# Patient Record
Sex: Male | Born: 1990 | Race: Black or African American | Hispanic: No | Marital: Single | Smoking: Never smoker
Health system: Southern US, Community
[De-identification: ages and names within clinical notes are randomized; demographics above are authoritative.]

## PROBLEM LIST (undated history)

## (undated) DIAGNOSIS — E119 Type 2 diabetes mellitus without complications: Secondary | ICD-10-CM

---

## 2019-05-30 ENCOUNTER — Encounter (HOSPITAL_COMMUNITY): Payer: Self-pay

## 2019-05-30 ENCOUNTER — Other Ambulatory Visit: Payer: Self-pay

## 2019-05-30 DIAGNOSIS — K219 Gastro-esophageal reflux disease without esophagitis: Secondary | ICD-10-CM | POA: Diagnosis present

## 2019-05-30 DIAGNOSIS — Z8249 Family history of ischemic heart disease and other diseases of the circulatory system: Secondary | ICD-10-CM

## 2019-05-30 DIAGNOSIS — Z7289 Other problems related to lifestyle: Secondary | ICD-10-CM

## 2019-05-30 DIAGNOSIS — I471 Supraventricular tachycardia: Secondary | ICD-10-CM | POA: Diagnosis present

## 2019-05-30 DIAGNOSIS — Z833 Family history of diabetes mellitus: Secondary | ICD-10-CM

## 2019-05-30 DIAGNOSIS — Z79899 Other long term (current) drug therapy: Secondary | ICD-10-CM

## 2019-05-30 DIAGNOSIS — E861 Hypovolemia: Secondary | ICD-10-CM | POA: Diagnosis present

## 2019-05-30 DIAGNOSIS — E1010 Type 1 diabetes mellitus with ketoacidosis without coma: Secondary | ICD-10-CM | POA: Diagnosis not present

## 2019-05-30 DIAGNOSIS — N179 Acute kidney failure, unspecified: Secondary | ICD-10-CM | POA: Diagnosis present

## 2019-05-30 DIAGNOSIS — I429 Cardiomyopathy, unspecified: Secondary | ICD-10-CM | POA: Diagnosis present

## 2019-05-30 DIAGNOSIS — F329 Major depressive disorder, single episode, unspecified: Secondary | ICD-10-CM | POA: Diagnosis present

## 2019-05-30 DIAGNOSIS — Z794 Long term (current) use of insulin: Secondary | ICD-10-CM

## 2019-05-30 DIAGNOSIS — Z20822 Contact with and (suspected) exposure to covid-19: Secondary | ICD-10-CM | POA: Diagnosis present

## 2019-05-30 LAB — CBG MONITORING, ED: Glucose-Capillary: 270 mg/dL — ABNORMAL HIGH (ref 70–99)

## 2019-05-30 LAB — CBC
HCT: 51.6 % (ref 39.0–52.0)
Hemoglobin: 16.5 g/dL (ref 13.0–17.0)
MCH: 29 pg (ref 26.0–34.0)
MCHC: 32 g/dL (ref 30.0–36.0)
MCV: 90.8 fL (ref 80.0–100.0)
Platelets: 387 10*3/uL (ref 150–400)
RBC: 5.68 MIL/uL (ref 4.22–5.81)
RDW: 13.1 % (ref 11.5–15.5)
WBC: 12.5 10*3/uL — ABNORMAL HIGH (ref 4.0–10.5)
nRBC: 0 % (ref 0.0–0.2)

## 2019-05-30 LAB — BASIC METABOLIC PANEL
Anion gap: 22 — ABNORMAL HIGH (ref 5–15)
BUN: 11 mg/dL (ref 6–20)
CO2: 15 mmol/L — ABNORMAL LOW (ref 22–32)
Calcium: 9.8 mg/dL (ref 8.9–10.3)
Chloride: 101 mmol/L (ref 98–111)
Creatinine, Ser: 1.39 mg/dL — ABNORMAL HIGH (ref 0.61–1.24)
GFR calc Af Amer: >60 mL/min (ref >60)
GFR calc non Af Amer: >60 mL/min (ref >60)
Glucose, Bld: 317 mg/dL — ABNORMAL HIGH (ref 70–99)
Potassium: 4.8 mmol/L (ref 3.5–5.1)
Sodium: 138 mmol/L (ref 135–145)

## 2019-05-30 NOTE — ED Triage Notes (Signed)
Pt sts hyperglycemia, headache, 5 episodes of vomiting and dizziness and fatigue since yesterday morning. Type 1 diabetic.

## 2019-05-31 ENCOUNTER — Inpatient Hospital Stay (HOSPITAL_COMMUNITY): Payer: BC Managed Care – PPO

## 2019-05-31 ENCOUNTER — Inpatient Hospital Stay (HOSPITAL_COMMUNITY)
Admission: EM | Admit: 2019-05-31 | Discharge: 2019-06-01 | DRG: 638 | Disposition: A | Discharge status: Home / Self Care | Payer: BC Managed Care – PPO | Attending: Internal Medicine | Admitting: Internal Medicine

## 2019-05-31 DIAGNOSIS — Z8249 Family history of ischemic heart disease and other diseases of the circulatory system: Secondary | ICD-10-CM | POA: Diagnosis not present

## 2019-05-31 DIAGNOSIS — Z7289 Other problems related to lifestyle: Secondary | ICD-10-CM | POA: Diagnosis not present

## 2019-05-31 DIAGNOSIS — E861 Hypovolemia: Secondary | ICD-10-CM | POA: Diagnosis present

## 2019-05-31 DIAGNOSIS — Z79899 Other long term (current) drug therapy: Secondary | ICD-10-CM | POA: Diagnosis not present

## 2019-05-31 DIAGNOSIS — E1110 Type 2 diabetes mellitus with ketoacidosis without coma: Secondary | ICD-10-CM | POA: Diagnosis present

## 2019-05-31 DIAGNOSIS — Z20822 Contact with and (suspected) exposure to covid-19: Secondary | ICD-10-CM | POA: Diagnosis present

## 2019-05-31 DIAGNOSIS — E1010 Type 1 diabetes mellitus with ketoacidosis without coma: Secondary | ICD-10-CM | POA: Diagnosis present

## 2019-05-31 DIAGNOSIS — I428 Other cardiomyopathies: Secondary | ICD-10-CM | POA: Diagnosis not present

## 2019-05-31 DIAGNOSIS — Z833 Family history of diabetes mellitus: Secondary | ICD-10-CM | POA: Diagnosis not present

## 2019-05-31 DIAGNOSIS — I471 Supraventricular tachycardia, unspecified: Secondary | ICD-10-CM | POA: Diagnosis present

## 2019-05-31 DIAGNOSIS — K219 Gastro-esophageal reflux disease without esophagitis: Secondary | ICD-10-CM | POA: Diagnosis present

## 2019-05-31 DIAGNOSIS — F329 Major depressive disorder, single episode, unspecified: Secondary | ICD-10-CM | POA: Diagnosis present

## 2019-05-31 DIAGNOSIS — Z794 Long term (current) use of insulin: Secondary | ICD-10-CM | POA: Diagnosis not present

## 2019-05-31 DIAGNOSIS — N179 Acute kidney failure, unspecified: Secondary | ICD-10-CM | POA: Diagnosis present

## 2019-05-31 DIAGNOSIS — I429 Cardiomyopathy, unspecified: Secondary | ICD-10-CM | POA: Diagnosis present

## 2019-05-31 HISTORY — DX: Type 2 diabetes mellitus without complications: E11.9

## 2019-05-31 LAB — CBG MONITORING, ED
Glucose-Capillary: 288 mg/dL — ABNORMAL HIGH (ref 70–99)
Glucose-Capillary: 251 mg/dL — ABNORMAL HIGH (ref 70–99)
Glucose-Capillary: 190 mg/dL — ABNORMAL HIGH (ref 70–99)
Glucose-Capillary: 212 mg/dL — ABNORMAL HIGH (ref 70–99)
Glucose-Capillary: 163 mg/dL — ABNORMAL HIGH (ref 70–99)
Glucose-Capillary: 166 mg/dL — ABNORMAL HIGH (ref 70–99)
Glucose-Capillary: 174 mg/dL — ABNORMAL HIGH (ref 70–99)
Glucose-Capillary: 144 mg/dL — ABNORMAL HIGH (ref 70–99)

## 2019-05-31 LAB — GLUCOSE, CAPILLARY
Glucose-Capillary: 128 mg/dL — ABNORMAL HIGH (ref 70–99)
Glucose-Capillary: 166 mg/dL — ABNORMAL HIGH (ref 70–99)
Glucose-Capillary: 192 mg/dL — ABNORMAL HIGH (ref 70–99)
Glucose-Capillary: 228 mg/dL — ABNORMAL HIGH (ref 70–99)
Glucose-Capillary: 191 mg/dL — ABNORMAL HIGH (ref 70–99)

## 2019-05-31 LAB — BLOOD GAS, VENOUS
Acid-base deficit: 10.4 mmol/L — ABNORMAL HIGH (ref 0.0–2.0)
Bicarbonate: 15.3 mmol/L — ABNORMAL LOW (ref 20.0–28.0)
O2 Saturation: 56.8 %
Patient temperature: 98.6
pCO2, Ven: 34.7 mmHg — ABNORMAL LOW (ref 44.0–60.0)
pH, Ven: 7.268 (ref 7.250–7.430)
pO2, Ven: 31.3 mmHg — CL (ref 32.0–45.0)

## 2019-05-31 LAB — MAGNESIUM: Magnesium: 1.7 mg/dL (ref 1.7–2.4)

## 2019-05-31 LAB — BASIC METABOLIC PANEL
Anion gap: 19 — ABNORMAL HIGH (ref 5–15)
Anion gap: 11 (ref 5–15)
Anion gap: 15 (ref 5–15)
Anion gap: 8 (ref 5–15)
BUN: 10 mg/dL (ref 6–20)
BUN: 8 mg/dL (ref 6–20)
BUN: 8 mg/dL (ref 6–20)
BUN: 7 mg/dL (ref 6–20)
CO2: 15 mmol/L — ABNORMAL LOW (ref 22–32)
CO2: 15 mmol/L — ABNORMAL LOW (ref 22–32)
CO2: 17 mmol/L — ABNORMAL LOW (ref 22–32)
CO2: 21 mmol/L — ABNORMAL LOW (ref 22–32)
Calcium: 9.3 mg/dL (ref 8.9–10.3)
Calcium: 7.9 mg/dL — ABNORMAL LOW (ref 8.9–10.3)
Calcium: 8.8 mg/dL — ABNORMAL LOW (ref 8.9–10.3)
Calcium: 8.7 mg/dL — ABNORMAL LOW (ref 8.9–10.3)
Chloride: 101 mmol/L (ref 98–111)
Chloride: 105 mmol/L (ref 98–111)
Chloride: 106 mmol/L (ref 98–111)
Chloride: 107 mmol/L (ref 98–111)
Creatinine, Ser: 1.41 mg/dL — ABNORMAL HIGH (ref 0.61–1.24)
Creatinine, Ser: 1.52 mg/dL — ABNORMAL HIGH (ref 0.61–1.24)
Creatinine, Ser: 1.11 mg/dL (ref 0.61–1.24)
Creatinine, Ser: 0.88 mg/dL (ref 0.61–1.24)
GFR calc Af Amer: >60 mL/min (ref >60)
GFR calc Af Amer: >60 mL/min (ref >60)
GFR calc Af Amer: >60 mL/min (ref >60)
GFR calc Af Amer: >60 mL/min (ref >60)
GFR calc non Af Amer: >60 mL/min (ref >60)
GFR calc non Af Amer: >60 mL/min (ref >60)
GFR calc non Af Amer: >60 mL/min (ref >60)
GFR calc non Af Amer: >60 mL/min (ref >60)
Glucose, Bld: 277 mg/dL — ABNORMAL HIGH (ref 70–99)
Glucose, Bld: 173 mg/dL — ABNORMAL HIGH (ref 70–99)
Glucose, Bld: 146 mg/dL — ABNORMAL HIGH (ref 70–99)
Glucose, Bld: 215 mg/dL — ABNORMAL HIGH (ref 70–99)
Potassium: 4.8 mmol/L (ref 3.5–5.1)
Potassium: 4.9 mmol/L (ref 3.5–5.1)
Potassium: 3.7 mmol/L (ref 3.5–5.1)
Potassium: 4 mmol/L (ref 3.5–5.1)
Sodium: 135 mmol/L (ref 135–145)
Sodium: 131 mmol/L — ABNORMAL LOW (ref 135–145)
Sodium: 138 mmol/L (ref 135–145)
Sodium: 136 mmol/L (ref 135–145)

## 2019-05-31 LAB — URINALYSIS, ROUTINE W REFLEX MICROSCOPIC
Bilirubin Urine: NEGATIVE
Glucose, UA: >=500 mg/dL — AB
Hgb urine dipstick: NEGATIVE
Ketones, ur: 80 mg/dL — AB
Leukocytes,Ua: NEGATIVE
Nitrite: NEGATIVE
Protein, ur: 30 mg/dL — AB
Specific Gravity, Urine: 1.023 (ref 1.005–1.030)
pH: 5 (ref 5.0–8.0)

## 2019-05-31 LAB — HEMOGLOBIN A1C
Hgb A1c MFr Bld: 11.3 % — ABNORMAL HIGH (ref 4.8–5.6)
Mean Plasma Glucose: 277.61 mg/dL

## 2019-05-31 LAB — SARS CORONAVIRUS 2 BY RT PCR (HOSPITAL ORDER, PERFORMED IN ~~LOC~~ HOSPITAL LAB): SARS Coronavirus 2: NEGATIVE

## 2019-05-31 LAB — BETA-HYDROXYBUTYRIC ACID
Beta-Hydroxybutyric Acid: 6.4 mmol/L — ABNORMAL HIGH (ref 0.05–0.27)
Beta-Hydroxybutyric Acid: 3.85 mmol/L — ABNORMAL HIGH (ref 0.05–0.27)

## 2019-05-31 LAB — HIV ANTIBODY (ROUTINE TESTING W REFLEX): HIV Screen 4th Generation wRfx: NONREACTIVE

## 2019-05-31 LAB — MRSA PCR SCREENING: MRSA by PCR: NEGATIVE

## 2019-05-31 MED ORDER — INSULIN ASPART 100 UNIT/ML ~~LOC~~ SOLN
0.0000 [IU] | Freq: Three times a day (TID) | SUBCUTANEOUS | Status: DC
  Administered 2019-05-31: 5 [IU] via SUBCUTANEOUS
  Administered 2019-06-01: 8 [IU] via SUBCUTANEOUS
  Administered 2019-06-01: 5 [IU] via SUBCUTANEOUS

## 2019-05-31 MED ORDER — ONDANSETRON HCL 4 MG/2ML IJ SOLN
4.0000 mg | Freq: Once | INTRAMUSCULAR | Status: AC
  Administered 2019-05-31: 4 mg via INTRAVENOUS
  Filled 2019-05-31: qty 2

## 2019-05-31 MED ORDER — DEXTROSE-NACL 5-0.45 % IV SOLN: INTRAVENOUS | Status: DC

## 2019-05-31 MED ORDER — INSULIN ASPART 100 UNIT/ML ~~LOC~~ SOLN
6.0000 [IU] | Freq: Three times a day (TID) | SUBCUTANEOUS | Status: DC
  Administered 2019-05-31 – 2019-06-01 (×3): 6 [IU] via SUBCUTANEOUS

## 2019-05-31 MED ORDER — SODIUM CHLORIDE 0.9 % IV SOLN: INTRAVENOUS | Status: DC

## 2019-05-31 MED ORDER — DEXTROSE 50 % IV SOLN: 0.0000 mL | INTRAVENOUS | Status: DC | PRN

## 2019-05-31 MED ORDER — POTASSIUM CHLORIDE CRYS ER 20 MEQ PO TBCR
40.0000 meq | EXTENDED_RELEASE_TABLET | Freq: Once | ORAL | Status: AC
  Administered 2019-05-31: 40 meq via ORAL
  Filled 2019-05-31: qty 2

## 2019-05-31 MED ORDER — POTASSIUM CHLORIDE 10 MEQ/100ML IV SOLN
10.0000 meq | INTRAVENOUS | Status: AC
  Administered 2019-05-31 (×2): 10 meq via INTRAVENOUS
  Filled 2019-05-31 (×2): qty 100

## 2019-05-31 MED ORDER — INSULIN REGULAR(HUMAN) IN NACL 100-0.9 UT/100ML-% IV SOLN
INTRAVENOUS | Status: DC
  Administered 2019-05-31: 10 [IU]/h via INTRAVENOUS
  Filled 2019-05-31: qty 100

## 2019-05-31 MED ORDER — CARVEDILOL 3.125 MG PO TABS
3.1250 mg | ORAL_TABLET | Freq: Two times a day (BID) | ORAL | Status: DC
  Administered 2019-05-31 (×2): 3.125 mg via ORAL
  Filled 2019-05-31 (×2): qty 1

## 2019-05-31 MED ORDER — LACTATED RINGERS IV BOLUS
1000.0000 mL | INTRAVENOUS | Status: AC
  Administered 2019-05-31 (×2): 1000 mL via INTRAVENOUS

## 2019-05-31 MED ORDER — FLUOXETINE HCL 10 MG PO CAPS
10.0000 mg | ORAL_CAPSULE | Freq: Every day | ORAL | Status: DC
  Filled 2019-05-31 (×2): qty 1

## 2019-05-31 MED ORDER — PANTOPRAZOLE SODIUM 40 MG PO TBEC
40.0000 mg | DELAYED_RELEASE_TABLET | Freq: Every day | ORAL | Status: DC
  Administered 2019-05-31 – 2019-06-01 (×2): 40 mg via ORAL
  Filled 2019-05-31 (×2): qty 1

## 2019-05-31 MED ORDER — CHLORHEXIDINE GLUCONATE CLOTH 2 % EX PADS
6.0000 | MEDICATED_PAD | Freq: Every day | CUTANEOUS | Status: DC
  Administered 2019-05-31 – 2019-06-01 (×2): 6 via TOPICAL

## 2019-05-31 MED ORDER — INSULIN DETEMIR 100 UNIT/ML ~~LOC~~ SOLN
20.0000 [IU] | Freq: Two times a day (BID) | SUBCUTANEOUS | Status: DC
  Administered 2019-05-31 (×2): 20 [IU] via SUBCUTANEOUS
  Filled 2019-05-31 (×3): qty 0.2

## 2019-05-31 MED ORDER — HEPARIN SODIUM (PORCINE) 5000 UNIT/ML IJ SOLN
5000.0000 [IU] | Freq: Three times a day (TID) | INTRAMUSCULAR | Status: DC
  Administered 2019-05-31: 5000 [IU] via SUBCUTANEOUS
  Filled 2019-05-31 (×2): qty 1

## 2019-05-31 MED ORDER — FENTANYL CITRATE (PF) 100 MCG/2ML IJ SOLN
50.0000 ug | Freq: Once | INTRAMUSCULAR | Status: AC
  Administered 2019-05-31: 50 ug via INTRAVENOUS
  Filled 2019-05-31: qty 2

## 2019-05-31 MED ORDER — INSULIN REGULAR(HUMAN) IN NACL 100-0.9 UT/100ML-% IV SOLN
INTRAVENOUS | Status: DC
  Administered 2019-05-31: 3.4 [IU]/h via INTRAVENOUS
  Filled 2019-05-31: qty 100

## 2019-05-31 NOTE — ED Notes (Signed)
Attempted to call report on pt. Nurse is tied up in another room at this time and will call back for report shortly.

## 2019-05-31 NOTE — Progress Notes (Signed)
Inpatient Diabetes Program Recommendations  AACE/ADA: New Consensus Statement on Inpatient Glycemic Control (2015)  Target Ranges:  Prepandial:   less than 140 mg/dL      Peak postprandial:   less than 180 mg/dL (1-2 hours)      Critically ill patients:  140 - 180 mg/dL   Lab Results  Component Value Date   GLUCAP 128 (H) 05/31/2019   HGBA1C 11.3 (H) 05/30/2019    Review of Glycemic Control  Diabetes history: DM1 Outpatient Diabetes medications: Levemir 40 units QHS, Humalog 5-10 units tidwc Current orders for Inpatient glycemic control: Transitioning off insulin drip - Levemir 20 units bid, Novolog 0-15 units tidwc  HgbA1C - 11.3% - uncontrolled  Inpatient Diabetes Program Recommendations:     Add Novolog 6 units tidwc for meal coverage insulin. (Pt Type 1 and does not make any insulin - will need meal coverage for CHOs)   Spoke with pt about his Type 1 DM and HgbA1C of 11.3%. Pt states he lives in [place] and came to [place] to visit his GF. Forgot his Levemir pen and thought he had an extra one at [facility] house. Started feeling poorly, but thought it was food poisoning. When started vomiting, came to ED. Has had DM1 x 3 years. Uses [employer] CGM. States he sees MD every 3 months. We discussed tighter glucose control and calling MD when blood sugars are consistently > 180 mg/dL. Also instructed that if he ever forgets his insulin, he can always go to a [employer] and buy Novolin N or Novolin R. Pt appreciative of information. Discussed importance of controlling blood sugars to prevent long and short-term complications. Pt states he will "get more serious" about his diabetes, and this was a good wake-up call. Answered all questions.   Will follow closely.  Thank you. Ailene Ards, RD, LDN, CDE Inpatient Diabetes Coordinator [phone]

## 2019-05-31 NOTE — Progress Notes (Addendum)
PROGRESS NOTE  Derek Snyder QQV:[id number] DOB: [birth year] DOA: 05/31/2019 PCP: Patient, No Pcp Per   LOS: 0 days   Brief Narrative / Interim history: Derek Snyder is a 29 y.o. male with a diagnosis of T1DM in 2018 with ICU admission for DKA, non-compaction cardiomyopathy, PSVT, and GERD who presents to the ED with nausea, vomiting, chills, abdominal pain, body aches, headache and dizziness which began on 5/17 (although he told ED providers 5/19). He states his symptoms are similar to his previous DKA in 2018, "though not as bad as last time". Pt lives in [place] and is in town visiting his girlfriend. Pt is followed by PCP and Endocrinology in [place]. Per pt, last A1c in January was "12". Pt received his first Covid vaccination on Friday 5/14.   Subjective / 24h Interval events: Pt appears comfortable and resting in bed on rounding. He is currently "feeling much better" and nausea/vomiting/abdominal pain are relieved by current therapies. Pt currently denies any HA, dizziness. Pt states he has been taking his insulin as prescribed and eating normally prior to admission.    Assessment & Plan: Principal Problem Type 1 Diabetes Mellitus with Diabetic Ketoacidosis - VBG drawn in ED on 5/20 shows pH 7.268, Bicarb of 15.3 pCO2 34.7. Ketones 80 and >500 glucose in urine. Beta-Hydroxybutyric acid 6.40. Anion gap 11. Hemoglobin A1c 11.3. Pt begun on continuous NS and insulin drip in ED and currently on 5% dextrose/.45% NaCl with blood glucose 144. Continue CBG monitoring and fluids per DKA protocol. Pt NPO, strict I&Os. Upon resolution of DKA, resume sliding scale insulin and advance diabetic diet as tolerated.  Active Problems Non-compaction cardiomyopathy - EKG on 5/20 suggestive of L atrial enlargement. Continue carvedilol 3.125mg  and follow up with outpatient cardiology.  Paroxysmal SVT - EKG demonstrating NSR. Maintain cardiac monitoring. Follow up with outpatient  cardiology.  Gastroesophageal reflux disease - Continue pantoprazole 40mg . Outpatient follow-up with PCP.   Scheduled Meds: . carvedilol  3.125 mg Oral BID WC  . FLUoxetine  10 mg Oral Daily  . heparin  5,000 Units Subcutaneous Q8H  . pantoprazole  40 mg Oral Daily   Continuous Infusions: . sodium chloride    . dextrose 5 % and 0.45% NaCl 75 mL/hr at 05/31/19 0408  . insulin 2.8 Units/hr (05/31/19 0726)   PRN Meds:.dextrose  DVT prophylaxis: Heparin Code Status: Full code Family Communication: None  Status is: Inpatient  Remains inpatient appropriate because:IV treatments appropriate due to intensity of illness or inability to take PO and Inpatient level of care appropriate due to severity of illness   Dispo: The patient is from: Home              Anticipated d/c is to: Home              Anticipated d/c date is: 2 days              Patient currently is not medically stable to d/c.   Consultants:  None  Procedures:  2D echo: None Foley: None BiPAP: None HD: None   Microbiology  Negative Covid  Antimicrobials: None   Objective: Vitals:   05/31/19 0745 05/31/19 0800 05/31/19 0815 05/31/19 0830  BP: 110/64 102/64 97/61 106/61  Pulse: 82 83 76 80  Resp: 16 15 15 14   Temp:      TempSrc:      SpO2: 95% 98% 98% 96%  Weight:      Height:       No intake  or output data in the 24 hours ending 05/31/19 0901 Filed Weights   05/30/19 2242  Weight: 93 kg    Examination:  Constitutional: NAD Eyes: no scleral icterus ENMT: Mucous membranes are moist.  Neck: normal, supple Respiratory: clear to auscultation bilaterally, no wheezing, no crackles. Normal respiratory effort. No accessory muscle use.  Cardiovascular: Regular rate and rhythm, no murmurs / rubs / gallops. No LE edema. Good peripheral pulses Abdomen: non distended, no tenderness to palpation in all quadrants. Bowel sounds positive.  Musculoskeletal: no clubbing / cyanosis.  Skin: no  rashes Neurologic: grossly non-focal. Psychiatric: Normal judgment and insight. Alert and oriented x 3. Normal mood.    Data Reviewed: I have independently reviewed following labs and imaging studies   CBC: Recent Labs  Lab 05/30/19 2250  WBC 12.5*  HGB 16.5  HCT 51.6  MCV 90.8  PLT 387   Basic Metabolic Panel: Recent Labs  Lab 05/30/19 2250 05/31/19 0201 05/31/19 0544  NA 138 135 131*  K 4.8 4.8 4.9  CL 101 101 105  CO2 15* 15* 15*  GLUCOSE 317* 277* 173*  BUN 11 10 8   CREATININE 1.39* 1.41* 1.52*  CALCIUM 9.8 9.3 7.9*   Liver Function Tests: No results for input(s): AST, ALT, ALKPHOS, BILITOT, PROT, ALBUMIN in the last 168 hours. Coagulation Profile: No results for input(s): INR, PROTIME in the last 168 hours. HbA1C: Recent Labs    05/30/19 2250  HGBA1C 11.3*   CBG: Recent Labs  Lab 05/31/19 0332 05/31/19 0438 05/31/19 0538 05/31/19 0632 05/31/19 0721  GLUCAP 190* 212* 163* 174* 166*    Recent Results (from the past 240 hour(s))  SARS Coronavirus 2 by RT PCR (hospital order, performed in [facility] hospital lab) Nasopharyngeal Urine, Clean Catch     Status: None   Collection Time: 05/31/19  2:02 AM   Specimen: Urine, Clean Catch; Nasopharyngeal  Result Value Ref Range Status   SARS Coronavirus 2 NEGATIVE NEGATIVE Final    Comment: (NOTE) SARS-CoV-2 target nucleic acids are NOT DETECTED. The SARS-CoV-2 RNA is generally detectable in upper and lower respiratory specimens during the acute phase of infection. The lowest concentration of SARS-CoV-2 viral copies this assay can detect is 250 copies / mL. A negative result does not preclude SARS-CoV-2 infection and should not be used as the sole basis for treatment or other patient management decisions.  A negative result may occur with improper specimen collection / handling, submission of specimen other than nasopharyngeal swab, presence of viral mutation(s) within the areas targeted by this assay,  and inadequate number of viral copies (<250 copies / mL). A negative result must be combined with clinical observations, patient history, and epidemiological information. Fact Sheet for Patients:   06/02/19 Fact Sheet for Healthcare Providers: BoilerBrush.com.cy This test is not yet approved or cleared  by the https://pope.com/ FDA and has been authorized for detection and/or diagnosis of SARS-CoV-2 by FDA under an Emergency Use Authorization (EUA).  This EUA will remain in effect (meaning this test can be used) for the duration of the COVID-19 declaration under Section 564(b)(1) of the Act, 21 U.S.C. section 360bbb-3(b)(1), unless the authorization is terminated or revoked sooner. Performed at [facility], [address] [place], [place]      Radiology Studies: DG CHEST PORT 1 VIEW  Result Date: 05/31/2019 CLINICAL DATA:  Initial evaluation for DKA. EXAM: PORTABLE CHEST 1 VIEW COMPARISON:  None available. FINDINGS: Cardiac and mediastinal silhouettes are within normal limits.  Lungs mildly hypoinflated. No focal infiltrates. No edema or effusion. No pneumothorax. No acute osseous finding. IMPRESSION: No radiographic evidence for active cardiopulmonary disease. Electronically Signed   By: Rise Mu M.D.   On: 05/31/2019 04:53

## 2019-05-31 NOTE — Plan of Care (Signed)

## 2019-05-31 NOTE — Progress Notes (Addendum)
PROGRESS NOTE    Derek Snyder  QIW:[id number] DOB: 03/04/1990 DOA: 05/31/2019 PCP: Patient, No Pcp Per    Brief Narrative:  Patient admitted to the hospital with a working diagnosis of diabetes ketoacidosis.  29 year old male with type 1 diabetes mellitus, GERD, PSVT, and noncompaction cardiomyopathy who presents with nausea, vomiting and uncontrolled hyperglycemia for 24 hours.  Apparently patient not taking basal insulin for about 48 hours, he is currently traveling and forgot to bring his basal insulin.  His glucose has been uncontrolled, up to 200s over the last few months.  On his initial physical examination blood pressure 134/92, heart rate 92, respiratory rate 21, oxygen saturation 99%, his lungs were clear to auscultation bilaterally, heart S1-S2 present rhythmic, soft abdomen, no lower extremity edema. Sodium 138, potassium 4.8, chloride 101, bicarb 15, glucose 317, BUN 11, creatinine 1.39, anion gap 22, SARS COVID-19 negative.  Urine analysis more than 500 glucose, specific gravity 1.023.  Chest radiograph with no infiltrates.  EKG 88 bpm, normal axis, normal intervals, sinus rhythm, small Q-wave lead I aVL, no ST segment changes, lateral T wave inversions.    Assessment & Plan:   Principal Problem:   DKA (diabetic ketoacidoses) ([place]) Active Problems:   GERD (gastroesophageal reflux disease)   SVT (supraventricular tachycardia) (H[facility])   Non-compaction cardiomyopathy ([place])   1. Acute diabetes ketoacidosis. Patient with improved nausea and vomiting, he has been NPO since admission. His anion gap is closing down to 15 but not back to normal, and is capillary glucose has been 144, 128 and 166. K is down to 3,7 and bicarbonate continue to be low at 17.   Will continue medical therapy with insulin, will transition to sq and will advance his diet. Calculated insulin requirements base on insulin drip use is about 40 units. Will start with basal insulin 20 units bid, will add 6  units of premeal insulin and continue with insulin sliding scale.  Follow renal panel and mg this pm at 16:00. Patient continue to be at high risk for worsening acidosis.   2. AKI. Pre-renal renal injury, renal function with serum cr down to 1,11 with K at 3,7 and serum bicarbonate at 17.   Patient with no further nausea or vomiting, no diarrhea, will advance diet to carb modified and will hold on IV fluids and will follow on renal panel in pm.   3. SVT/ Non compaction cardiomyopathy. Will continue telemetry monitoring, no clinical signs of heart failure.   Continue with carvedilol.  4. GERD. Continue antiacid therapy.   Status is: Inpatient  Remains inpatient appropriate because:Hemodynamically unstable and Ongoing active pain requiring inpatient pain management. Continue need close monitoring of acid base status and electrolytes.    Dispo: The patient is from: Home              Anticipated d/c is to: Home              Anticipated d/c date is: 1 day              Patient currently is not medically stable to d/c.    DVT prophylaxis: Enoxaparin   Code Status:    full  Family Communication:  No family at the bedside      Subjective: Patient feeling better but not yet back to baseline, no nausea or vomiting, he has been npo since admission.   Objective: Vitals:   05/31/19 1007 05/31/19 1100 05/31/19 1200 05/31/19 1300  BP: 131/81 (!) 145/73 140/82 138/84  Pulse: 90 82 83 84  Resp: 15 13 16 17   Temp: 98.3 F (36.8 C)  97.6 F (36.4 C)   TempSrc: Oral  Oral   SpO2: 100% 100% 98% 100%  Weight: 89.7 kg     Height: 5\' 11"  (1.803 m)       Intake/Output Summary (Last 24 hours) at 05/31/2019 1435 Last data filed at 05/31/2019 1100 Gross per 24 hour  Intake 543.74 ml  Output --  Net 543.74 ml   Filed Weights   05/30/19 2242 05/31/19 1007  Weight: 93 kg 89.7 kg    Examination:   General: Not in pain or dyspnea, deconditioned  Neurology: Awake and alert, non focal  E  ENT: no pallor, no icterus, oral mucosa moist Cardiovascular: No JVD. S1-S2 present, rhythmic, no gallops, rubs, or murmurs. No lower extremity edema. Pulmonary: positive breath sounds bilaterally, adequate air movement, no wheezing, rhonchi or rales. Gastrointestinal. Abdomen with, no organomegaly. Skin. No rashes Musculoskeletal: no joint deformities     Data Reviewed: I have personally reviewed following labs and imaging studies  CBC: Recent Labs  Lab 05/30/19 2250  WBC 12.5*  HGB 16.5  HCT 51.6  MCV 90.8  PLT 387   Basic Metabolic Panel: Recent Labs  Lab 05/30/19 2250 05/31/19 0201 05/31/19 0544 05/31/19 1047  NA 138 135 131* 138  K 4.8 4.8 4.9 3.7  CL 101 101 105 106  CO2 15* 15* 15* 17*  GLUCOSE 317* 277* 173* 146*  BUN 11 10 8 8   CREATININE 1.39* 1.41* 1.52* 1.11  CALCIUM 9.8 9.3 7.9* 8.8*   GFR: Estimated Creatinine Clearance: 105.5 mL/min (by C-G formula based on SCr of 1.11 mg/dL). Liver Function Tests: No results for input(s): AST, ALT, ALKPHOS, BILITOT, PROT, ALBUMIN in the last 168 hours. No results for input(s): LIPASE, AMYLASE in the last 168 hours. No results for input(s): AMMONIA in the last 168 hours. Coagulation Profile: No results for input(s): INR, PROTIME in the last 168 hours. Cardiac Enzymes: No results for input(s): CKTOTAL, CKMB, CKMBINDEX, TROPONINI in the last 168 hours. BNP (last 3 results) No results for input(s): PROBNP in the last 8760 hours. HbA1C: Recent Labs    05/30/19 2250  HGBA1C 11.3*   CBG: Recent Labs  Lab 05/31/19 0632 05/31/19 0721 05/31/19 0927 05/31/19 1150 05/31/19 1313  GLUCAP 174* 166* 144* 128* 166*   Lipid Profile: No results for input(s): CHOL, HDL, LDLCALC, TRIG, CHOLHDL, LDLDIRECT in the last 72 hours. Thyroid Function Tests: No results for input(s): TSH, T4TOTAL, FREET4, T3FREE, THYROIDAB in the last 72 hours. Anemia Panel: No results for input(s): VITAMINB12, FOLATE, FERRITIN, TIBC, IRON,  RETICCTPCT in the last 72 hours.    Radiology Studies: I have reviewed all of the imaging during this hospital visit personally     Scheduled Meds: . carvedilol  3.125 mg Oral BID WC  . Chlorhexidine Gluconate Cloth  6 each Topical Daily  . FLUoxetine  10 mg Oral Daily  . heparin  5,000 Units Subcutaneous Q8H  . insulin aspart  0-15 Units Subcutaneous TID WC  . insulin aspart  6 Units Subcutaneous TID WC  . insulin detemir  20 Units Subcutaneous BID  . pantoprazole  40 mg Oral Daily   Continuous Infusions: . dextrose 5 % and 0.45% NaCl 75 mL/hr at 05/31/19 1100  . insulin 1.6 mL/hr at 05/31/19 1100     LOS: 0 days        Marlette Curvin 06/02/19, MD

## 2019-05-31 NOTE — H&P (Signed)
History and Physical    Derek Snyder [id number]:[id number] DOB: 07-28-1990 DOA: 05/31/2019  PCP: Patient, No Pcp Per  Patient coming from: Home  I have personally briefly reviewed patient's old medical records in [facility]  Chief Complaint: Nausea vomiting and hyperglycemia since yesterday.  HPI: Derek Snyder is a 29 y.o. male with poorly controlled DM type I, GERD, PSVT, noncompaction cardiomyopathy EF 45-50% presented with above.  States he has been taking his insulin as prescribed and been eating normally.  He is not sure what triggered DKA today.  No fevers, cough, diarrhea.    Symptoms started Monday with nausea vomiting and intermittent abdominal pain.  Abdominal pain initially colicky 12 out of 10 without any alleviating or aggravating factors. There was associated nausea and vomiting.  He denies any fever chills.  No cough.  No diarrhea.  Personal social history.  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  Confirmed is an use person and works in [place].  Denies any smoking.  Occasional EtOH use.  Denies any drugs.  Family history: Mother 14 with hypertension.  Dad is 68 also with hypertension.  Maternal grandmother with diabetes.   ED Course:  Vital signs with 134/92, heart rate 92, respiratory rate 18, SPO2 90s on room air.  -Labs with sodium 138, potassium 4.8, bicarb 15, serum glucose 277 -Anion gap 22-> 19 -Beta hydroxybutyrate acid 6.49 -VBG with 7.2 6/34/31/15  -BUN 10, creatinine 1.41-WBC 12.5- -Urinalysis with glucose more than 500, positive for ketones. -Covid PCR negative   ED medications: -1 L of Ringer lactate -Zofran -50 MCG of fentanyl  Review of Systems: As per HPI otherwise 10 point review of systems negative.   Review of Systems  Constitutional: Negative.   HENT: Negative.   Eyes: Negative.   Respiratory: Negative.   Cardiovascular: Negative.   Gastrointestinal: Positive for abdominal pain, nausea and  vomiting. Negative for blood in stool, constipation, diarrhea and melena.  Genitourinary: Negative.   Musculoskeletal: Negative.   Skin: Negative.   Neurological: Negative.   Endo/Heme/Allergies: Negative.   Psychiatric/Behavioral: Negative.    Past Medical History:  Diagnosis Date  . Diabetes mellitus without complication (HCC)    type 1     History reviewed. No pertinent surgical history.   reports that he has never smoked. He has never used smokeless tobacco. He reports current alcohol use. He reports that he does not use drugs.  No Known Allergies  No family history on file.   Prior to Admission medications   Medication Sig Start Date End Date Taking? Authorizing Provider  HUMALOG KWIKPEN 100 UNIT/ML KwikPen See admin instructions. INJECT 5-10 UNITS WITH EACH MEAL PLUSE SCALE* MAX DAILY DOSE OF 50 UNITS* 01/25/19  Yes [provider]  LEVEMIR FLEXTOUCH 100 UNIT/ML FlexPen See admin instructions. INJECT 40 UNITS INTO THE SKIN AT BEDTIME 03/30/19  Yes [provider]  pantoprazole (PROTONIX) 40 MG tablet Take 40 mg by mouth daily. 01/17/19  Yes [provider]    Physical Exam: Vitals:   05/30/19 2242 05/31/19 0055 05/31/19 0215 05/31/19 0230  BP:  (!) 134/92 132/78 (!) 117/101  Pulse:  92 78 (!) 102  Resp:  18 (!) 21 17  Temp:      TempSrc:      SpO2:  100% 99% 99%  Weight: 93 kg     Height: 5\' 11"  (1.803 m)       Constitutional: NAD, calm, comfortable Vitals:   05/30/19 2242  05/31/19 0055 05/31/19 0215 05/31/19 0230  BP:  (!) 134/92 132/78 (!) 117/101  Pulse:  92 78 (!) 102  Resp:  18 (!) 21 17  Temp:      TempSrc:      SpO2:  100% 99% 99%  Weight: 93 kg     Height: 5\' 11"  (1.803 m)      Eyes: PERRL, lids and conjunctivae normal ENMT: Mucous membranes are moist. Posterior pharynx clear of any exudate or lesions.Normal dentition.  Neck: normal, supple, no masses, no thyromegaly Respiratory: clear to auscultation bilaterally, no  wheezing, no crackles. Normal respiratory effort. No accessory muscle use.  Cardiovascular: Regular rate and rhythm, no murmurs / rubs / gallops. No extremity edema. 2+ pedal pulses. No carotid bruits.  Abdomen: no tenderness, no masses palpated. No hepatosplenomegaly. Bowel sounds positive.  Musculoskeletal: no clubbing / cyanosis. No joint deformity upper and lower extremities. Good ROM, no contractures. Normal muscle tone.  Skin: no rashes, lesions, ulcers. No induration Neurologic: CN 2-12 grossly intact. Sensation intact, DTR normal. Strength 5/5 in all 4.  Psychiatric: Normal judgment and insight. Alert and oriented x 3. Normal mood.   Labs on Admission: I have personally reviewed following labs and imaging studies  CBC: Recent Labs  Lab 05/30/19 2250  WBC 12.5*  HGB 16.5  HCT 51.6  MCV 90.8  PLT 833   Basic Metabolic Panel: Recent Labs  Lab 05/30/19 2250 05/31/19 0201  NA 138 135  K 4.8 4.8  CL 101 101  CO2 15* 15*  GLUCOSE 317* 277*  BUN 11 10  CREATININE 1.39* 1.41*  CALCIUM 9.8 9.3   GFR: Estimated Creatinine Clearance: 90.9 mL/min (A) (by C-G formula based on SCr of 1.41 mg/dL (H)). Liver Function Tests: No results for input(s): AST, ALT, ALKPHOS, BILITOT, PROT, ALBUMIN in the last 168 hours. No results for input(s): LIPASE, AMYLASE in the last 168 hours. No results for input(s): AMMONIA in the last 168 hours. Coagulation Profile: No results for input(s): INR, PROTIME in the last 168 hours. Cardiac Enzymes: No results for input(s): CKTOTAL, CKMB, CKMBINDEX, TROPONINI in the last 168 hours. BNP (last 3 results) No results for input(s): PROBNP in the last 8760 hours. HbA1C: No results for input(s): HGBA1C in the last 72 hours. CBG: Recent Labs  Lab 05/30/19 2240 05/31/19 0140 05/31/19 0244  GLUCAP 270* 288* 251*   Lipid Profile: No results for input(s): CHOL, HDL, LDLCALC, TRIG, CHOLHDL, LDLDIRECT in the last 72 hours. Thyroid Function Tests: No  results for input(s): TSH, T4TOTAL, FREET4, T3FREE, THYROIDAB in the last 72 hours. Anemia Panel: No results for input(s): VITAMINB12, FOLATE, FERRITIN, TIBC, IRON, RETICCTPCT in the last 72 hours. Urine analysis:    Component Value Date/Time   COLORURINE STRAW (A) 05/30/2019 2250   APPEARANCEUR CLEAR 05/30/2019 2250   LABSPEC 1.023 05/30/2019 2250   PHURINE 5.0 05/30/2019 2250   GLUCOSEU >=500 (A) 05/30/2019 2250   HGBUR NEGATIVE 05/30/2019 2250   BILIRUBINUR NEGATIVE 05/30/2019 2250   KETONESUR 80 (A) 05/30/2019 2250   PROTEINUR 30 (A) 05/30/2019 2250   NITRITE NEGATIVE 05/30/2019 2250   LEUKOCYTESUR NEGATIVE 05/30/2019 2250    Radiological Exams on Admission: No results found.  EKG: Independently reviewed.  NSR at 88 bpm, normal axis, T wave inversion inferolateral leads.  Intervals within normal limits.  Assessment/Plan Active Problems:   DKA (diabetic ketoacidoses) ([place])    #DKA -Unclear trigger. -Patient takes Levemir 40 units bedtime and reports compliance. -We will continue management per DKA  protocol.  #Noncompaction cardiomyopathy -Continue with Coreg, aspirin.  #Depression -Continue with Prozac  #GERD -Continue with Protonix.  DVT prophylaxis: Subcu heparin.    Code Status: Full code   Family Communication: None  Disposition Plan: Home.    Consults called: None. Admission status: Inpatient   Mirabelle Cyphers MD [facility]   If 7PM-7AM, please contact night-coverage [email or web address] Password [email or web address]  05/31/2019, 3:01 AM

## 2019-05-31 NOTE — ED Provider Notes (Addendum)
TIME SEEN: 1:08 AM  CHIEF COMPLAINT: Hyperglycemia, abdominal pain, nausea and vomiting, lightheadedness  HPI: Patient is a 29 year old male with history of type 1 diabetes currently on insulin who was diagnosed about 3 years ago with diabetes when he was found to be in DKA in [facility] and was admitted to the ICU for several days who presents today with similar symptoms to his previous episode of DKA including hyperglycemia, diffuse abdominal pain, nausea and vomiting, lightheadedness today.  States he has been taking his insulin as prescribed and been eating normally.  He is not sure what triggered DKA today.  No fevers, cough, diarrhea.  He had his first Covid vaccination on Friday.  ROS: See HPI Constitutional: no fever  Eyes: no drainage  ENT: no runny nose   Cardiovascular:  no chest pain  Resp: no SOB  GI:  vomiting GU: no dysuria Integumentary: no rash  Allergy: no hives  Musculoskeletal: no leg swelling  Neurological: no slurred speech ROS otherwise negative  PAST MEDICAL HISTORY/PAST SURGICAL HISTORY:  Past Medical History:  Diagnosis Date  . Diabetes mellitus without complication (HCC)    type 1     MEDICATIONS:  Prior to Admission medications   Not on File    ALLERGIES:  No Known Allergies  SOCIAL HISTORY:  Social History   Tobacco Use  . Smoking status: Never Smoker  . Smokeless tobacco: Never Used  Substance Use Topics  . Alcohol use: Yes    Comment: occ    FAMILY HISTORY: No family history on file.  EXAM: BP (!) 134/92 (BP Location: Left Arm)   Pulse 92   Temp 98.1 F (36.7 C) (Oral)   Resp 18   Ht 5\' 11"  (1.803 m)   Wt 93 kg   SpO2 100%   BMI 28.59 kg/m  CONSTITUTIONAL: Alert and oriented and responds appropriately to questions. Well-appearing; well-nourished, afebrile, nontoxic HEAD: Normocephalic EYES: Conjunctivae clear, pupils appear equal, EOM appear intact ENT: normal nose; dry mucous membranes NECK: Supple, normal  ROM CARD: RRR; S1 and S2 appreciated; no murmurs, no clicks, no rubs, no gallops RESP: Normal chest excursion without splinting or tachypnea; breath sounds clear and equal bilaterally; no wheezes, no rhonchi, no rales, no hypoxia or respiratory distress, speaking full sentences, no Kussmaul respirations ABD/GI: Normal bowel sounds; non-distended; soft, non-tender, no rebound, no guarding, no peritoneal signs, no hepatosplenomegaly BACK:  The back appears normal EXT: Normal ROM in all joints; no deformity noted, no edema; no cyanosis SKIN: Normal color for age and race; warm; no rash on exposed skin NEURO: Moves all extremities equally PSYCH: The patient's mood and manner are appropriate.   MEDICAL DECISION MAKING: Patient here with DKA.  Bicarb is 15 and anion gap is 22.  Will obtain urinalysis, VBG.  Will recheck blood sugar and initiate IV fluids and IV insulin infusion.  Will treat symptomatically with pain and nausea medicine.  Patient will need admission.  His PCP and endocrinologist are both in [facility].  ED PROGRESS: Blood gas shows pH of 7.268 with a bicarb of 15.3.  Large ketones in the urine.  Will consult hospitalist for admission.  3:19 AM Discussed patient's case with hospitalist, Dr. [facility].  I have recommended admission and patient (and family if present) agree with this plan. Admitting physician will place admission orders.   I reviewed all nursing notes, vitals, pertinent previous records and reviewed/interpreted all EKGs, lab and urine results, imaging (as available).    EKG Interpretation  Date/Time:  Thursday May 31 2019 03:37:31 EDT Ventricular Rate:  88 PR Interval:    QRS Duration: 81 QT Interval:  287 QTC Calculation: 348 R Axis:   35 Text Interpretation: Sinus rhythm Probable left atrial enlargement RSR' in V1 or V2, probably normal variant Borderline T wave abnormalities No old tracing to compare Confirmed by Ward, Cyril Mourning [phone]) on 05/31/2019  3:41:28 AM        CRITICAL CARE Performed by: Cyril Mourning Ward   Total critical care time: 65 minutes  Critical care time was exclusive of separately billable procedures and treating other patients.  Critical care was necessary to treat or prevent imminent or life-threatening deterioration.  Critical care was time spent personally by me on the following activities: development of treatment plan with patient and/or surrogate as well as nursing, discussions with consultants, evaluation of patient's response to treatment, examination of patient, obtaining history from patient or surrogate, ordering and performing treatments and interventions, ordering and review of laboratory studies, ordering and review of radiographic studies, pulse oximetry and re-evaluation of patient's condition.   Jassiel Dshawn Stencel was evaluated in Emergency Department on 05/31/2019 for the symptoms described in the history of present illness. He was evaluated in the context of the global COVID-19 pandemic, which necessitated consideration that the patient might be at risk for infection with the SARS-CoV-2 virus that causes COVID-19. Institutional protocols and algorithms that pertain to the evaluation of patients at risk for COVID-19 are in a state of rapid change based on information released by regulatory bodies including the CDC and federal and state organizations. These policies and algorithms were followed during the patient's care in the ED.      Ward, Delice Bison, DO 05/31/19 0320    Ward, Delice Bison, DO 05/31/19 [id number]

## 2019-05-31 NOTE — ED Notes (Signed)
Pt states that he's unable to give urine at this time. 

## 2019-06-01 LAB — GLUCOSE, CAPILLARY
Glucose-Capillary: 223 mg/dL — ABNORMAL HIGH (ref 70–99)
Glucose-Capillary: 295 mg/dL — ABNORMAL HIGH (ref 70–99)

## 2019-06-01 LAB — BASIC METABOLIC PANEL
Anion gap: 13 (ref 5–15)
BUN: 8 mg/dL (ref 6–20)
CO2: 19 mmol/L — ABNORMAL LOW (ref 22–32)
Calcium: 8.9 mg/dL (ref 8.9–10.3)
Chloride: 104 mmol/L (ref 98–111)
Creatinine, Ser: 1.06 mg/dL (ref 0.61–1.24)
GFR calc Af Amer: >60 mL/min (ref >60)
GFR calc non Af Amer: >60 mL/min (ref >60)
Glucose, Bld: 271 mg/dL — ABNORMAL HIGH (ref 70–99)
Potassium: 4.3 mmol/L (ref 3.5–5.1)
Sodium: 136 mmol/L (ref 135–145)

## 2019-06-01 LAB — MAGNESIUM: Magnesium: 1.6 mg/dL — ABNORMAL LOW (ref 1.7–2.4)

## 2019-06-01 MED ORDER — MAGNESIUM SULFATE 2 GM/50ML IV SOLN
2.0000 g | Freq: Once | INTRAVENOUS | Status: AC
  Administered 2019-06-01: 2 g via INTRAVENOUS
  Filled 2019-06-01: qty 50

## 2019-06-01 MED ORDER — INSULIN DETEMIR 100 UNIT/ML ~~LOC~~ SOLN
40.0000 [IU] | Freq: Every day | SUBCUTANEOUS | Status: DC
  Administered 2019-06-01: 40 [IU] via SUBCUTANEOUS
  Filled 2019-06-01: qty 0.4

## 2019-06-01 NOTE — Progress Notes (Addendum)
Pt d/c to home. AVS and f/u needs reviewed with pt. Pt confirmed understanding with teachback. PCT removed IV without complication. Pt escorted off of unit by PCT to car and care of girlfriend.

## 2019-06-01 NOTE — TOC Initial Note (Signed)
Transition of Care [facility]) - Initial/Assessment Note    Patient Details  Name: Derek Snyder MRN: [id number] Date of Birth: 1990-06-09  Transition of Care [facility]) CM/SW Contact:    Golda Acre, RN Phone Number: 06/01/2019, 8:19 AM  Clinical Narrative:                 dka- and hypomagnesia , iv mgso4 x2gms x1dose iv,anion gap closed. Plans: move to floor with poss. Dc to home.  Expected Discharge Plan: Home/Self Care Barriers to Discharge: Continued Medical Work up   Patient Goals and CMS Choice Patient states their goals for this hospitalization and ongoing recovery are:: to go home CMS Medicare.gov Compare Post Acute Care list provided to:: Patient    Expected Discharge Plan and Services Expected Discharge Plan: Home/Self Care   Discharge Planning Services: CM Consult   Living arrangements for the past 2 months: Single Family Home                                      Prior Living Arrangements/Services Living arrangements for the past 2 months: Single Family Home Lives with:: Spouse Patient language and need for interpreter reviewed:: No Do you feel safe going back to the place where you live?: Yes      Need for Family Participation in Patient Care: Yes (Comment) Care giver support system in place?: Yes (comment)   Criminal Activity/Legal Involvement Pertinent to Current Situation/Hospitalization: No - Comment as needed  Activities of Daily Living Home Assistive Devices/Equipment: CBG Meter ADL Screening (condition at time of admission) Patient's cognitive ability adequate to safely complete daily activities?: Yes Is the patient deaf or have difficulty hearing?: No Does the patient have difficulty seeing, even when wearing glasses/contacts?: No Does the patient have difficulty concentrating, remembering, or making decisions?: No Patient able to express need for assistance with ADLs?: Yes Does the patient have difficulty dressing or bathing?:  No Independently performs ADLs?: Yes (appropriate for developmental age) Does the patient have difficulty walking or climbing stairs?: No Weakness of Legs: None Weakness of Arms/Hands: None  Permission Sought/Granted                  Emotional Assessment Appearance:: Appears stated age Attitude/Demeanor/Rapport: Engaged Affect (typically observed): Calm Orientation: : Oriented to Self, Oriented to Place, Oriented to  Time, Oriented to Situation Alcohol / Substance Use: Not Applicable Psych Involvement: No (comment)  Admission diagnosis:  DKA (diabetic ketoacidoses) (HCC) [E11.10] Type 1 diabetes mellitus with ketoacidosis without coma (HCC) [E10.10] Patient Active Problem List   Diagnosis Date Noted  . DKA (diabetic ketoacidoses) (HCC) 05/31/2019  . GERD (gastroesophageal reflux disease) 05/31/2019  . SVT (supraventricular tachycardia) (HCC) 05/31/2019  . 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

## 2019-06-01 NOTE — Discharge Summary (Signed)
Physician Discharge Summary  Derek Snyder NID:[id number] DOB: [birth year] DOA: 05/31/2019  PCP: Patient, No Pcp Per  Admit date: 05/31/2019 Discharge date: 06/01/2019  Admitted From: Home  Disposition:  Home   Recommendations for Outpatient Follow-up and new medication changes:  1. Follow up with Primary Care in 7 days.  2. Patient now has refilled his insulin levimir.   Home Health: no   Equipment/Devices: no    Discharge Condition: stable  CODE STATUS: full  Diet recommendation: diabetic prudent.   Brief/Interim Summary: Patient was admitted to the hospital with a working diagnosis of diabetes ketoacidosis.  29 year old male with type 1 diabetes mellitus, GERD, PSVT, and noncompaction cardiomyopathy who presents with nausea, vomiting and uncontrolled hyperglycemia for 24 hours.  Apparently patient not taking basal insulin for about 48 hours, he is currently traveling and forgot to bring his basal insulin with him.  His glucose has been uncontrolled, up to 200s over the last few months.  On his initial physical examination blood pressure 134/92, heart rate 92, respiratory rate 21, oxygen saturation 99%, his lungs were clear to auscultation bilaterally, heart S1-S2 present rhythmic, soft abdomen, no lower extremity edema. Sodium 138, potassium 4.8, chloride 101, bicarb 15, glucose 317, BUN 11, creatinine 1.39, anion gap 22, SARS COVID-19 negative.  Urine analysis more than 500 glucose, specific gravity 1.023.  Chest radiograph with no infiltrates.  EKG 88 bpm, normal axis, normal intervals, sinus rhythm, small Q-wave lead I aVL, no ST segment changes, positive lateral T wave inversions.  Patient received IV fluids and IV insulin with good toleration, anion gap closed, and his diet was advanced with no nausea or vomiting.  He was successffully transition to sq insulin.   1. Acute diabetes ketoacidosis. Patient was admitted to the progressive care unit, he received intravenous fluids  with isotonic saline, and intravenous insulin. His anion gap closed, his symptoms improved and he was transitioned successfully to subcutaneous insulin.  Patient will resume taking basal insulin 40 units along with sliding scale. Follow-up as an outpatient with endocrinology.Patient now has a new refill for his basal insulin. Patient might be a good candidate for insulin pump.  2. Acute kidney injury with hypomagnesemia. Hypovolemic kidney injury, patient received isotonic saline with improvement of kidney function. His peak creatinine reached 1.52, at discharge is down to 1.06, his sodium 136, potassium 4.3, chloride 104, bicarb 19, anion gap 13. His magnesium was 1.7 and 1.6, he received magnesium sulfate intravenously before discharge.  3. History of SVT, and noncompaction cardiomyopathy. Patient remained hemodynamically stable, no arrhythmias, follow-up as an outpatient.  4. GERD. Continue with pantoprazole.   Discharge Diagnoses:  Principal Problem:   DKA (diabetic ketoacidoses) (HCC) Active Problems:   GERD (gastroesophageal reflux disease)   SVT (supraventricular tachycardia) (HCC)   Non-compaction cardiomyopathy (HCC)    Discharge Instructions   Allergies as of 06/01/2019   No Known Allergies     Medication List    TAKE these medications   HumaLOG KwikPen 100 UNIT/ML KwikPen Generic drug: insulin lispro See admin instructions. INJECT 5-10 UNITS WITH EACH MEAL PLUSE SCALE* MAX DAILY DOSE OF 50 UNITS*   Levemir FlexTouch 100 UNIT/ML FlexPen Generic drug: insulin detemir See admin instructions. INJECT 40 UNITS INTO THE SKIN AT BEDTIME   pantoprazole 40 MG tablet Commonly known as: PROTONIX Take 40 mg by mouth daily.       No Known Allergies      Procedures/Studies: DG CHEST PORT 1 VIEW  Result Date: 05/31/2019 CLINICAL DATA:  Initial evaluation for DKA. EXAM: PORTABLE CHEST 1 VIEW COMPARISON:  None available. FINDINGS: Cardiac and mediastinal silhouettes are  within normal limits. Lungs mildly hypoinflated. No focal infiltrates. No edema or effusion. No pneumothorax. No acute osseous finding. IMPRESSION: No radiographic evidence for active cardiopulmonary disease. Electronically Signed   By: Rise Mu M.D.   On: 05/31/2019 04:53       Subjective: Patient is feeling well, no nausea or vomiting, no chest pain or dyspnea. Tolerating po well.   Discharge Exam: Vitals:   06/01/19 0600 06/01/19 0800  BP: 120/62   Pulse: 74   Resp: 13   Temp:  98.3 F (36.8 C)  SpO2: 99%    Vitals:   06/01/19 0338 06/01/19 0400 06/01/19 0600 06/01/19 0800  BP:  129/88 120/62   Pulse:  73 74   Resp:  17 13   Temp: 98.5 F (36.9 C)   98.3 F (36.8 C)  TempSrc: Oral   Oral  SpO2:  99% 99%   Weight:      Height:        General: Not in pain or dyspnea.  Neurology: Awake and alert, non focal  E ENT: no pallor, no icterus, oral mucosa moist Cardiovascular: No JVD. S1-S2 present, rhythmic, no gallops, rubs, or murmurs. No lower extremity edema. Pulmonary: positive breath sounds bilaterally, adequate air movement, no wheezing, rhonchi or rales. Gastrointestinal. Abdomen soft with no organomegaly, non tender, no rebound or guarding Skin. No rashes Musculoskeletal: no joint deformities   The results of significant diagnostics from this hospitalization (including imaging, microbiology, ancillary and laboratory) are listed below for reference.     Microbiology: Recent Results (from the past 240 hour(s))  SARS Coronavirus 2 by RT PCR (hospital order, performed in [facility] hospital lab) Nasopharyngeal Urine, Clean Catch     Status: None   Collection Time: 05/31/19  2:02 AM   Specimen: Urine, Clean Catch; Nasopharyngeal  Result Value Ref Range Status   SARS Coronavirus 2 NEGATIVE NEGATIVE Final    Comment: (NOTE) SARS-CoV-2 target nucleic acids are NOT DETECTED. The SARS-CoV-2 RNA is generally detectable in upper and lower respiratory  specimens during the acute phase of infection. The lowest concentration of SARS-CoV-2 viral copies this assay can detect is 250 copies / mL. A negative result does not preclude SARS-CoV-2 infection and should not be used as the sole basis for treatment or other patient management decisions.  A negative result may occur with improper specimen collection / handling, submission of specimen other than nasopharyngeal swab, presence of viral mutation(s) within the areas targeted by this assay, and inadequate number of viral copies (<250 copies / mL). A negative result must be combined with clinical observations, patient history, and epidemiological information. Fact Sheet for Patients:   BoilerBrush.com.cy Fact Sheet for Healthcare Providers: https://pope.com/ This test is not yet approved or cleared  by the [place] FDA and has been authorized for detection and/or diagnosis of SARS-CoV-2 by FDA under an Emergency Use Authorization (EUA).  This EUA will remain in effect (meaning this test can be used) for the duration of the COVID-19 declaration under Section 564(b)(1) of the Act, 21 U.S.C. section 360bbb-3(b)(1), unless the authorization is terminated or revoked sooner. Performed at [facility], [address], [place], [place] [address]   MRSA PCR Screening     Status: None   Collection Time: 05/31/19 10:31 AM   Specimen: Nasal Mucosa; Nasopharyngeal  Result Value Ref Range Status   MRSA by  PCR NEGATIVE NEGATIVE Final    Comment:        The GeneXpert MRSA Assay (FDA approved for NASAL specimens only), is one component of a comprehensive MRSA colonization surveillance program. It is not intended to diagnose MRSA infection nor to guide or monitor treatment for MRSA infections. Performed at [facility], [address] [place], [place] [address]      Labs: BNP (last 3 results) No results  for input(s): BNP in the last 8760 hours. Basic Metabolic Panel: Recent Labs  Lab 05/31/19 0201 05/31/19 0544 05/31/19 1047 05/31/19 1552 06/01/19 0237  NA 135 131* 138 136 136  K 4.8 4.9 3.7 4.0 4.3  CL 101 105 106 107 104  CO2 15* 15* 17* 21* 19*  GLUCOSE 277* 173* 146* 215* 271*  BUN 10 8 8 7 8   CREATININE 1.41* 1.52* 1.11 0.88 1.06  CALCIUM 9.3 7.9* 8.8* 8.7* 8.9  MG  --   --   --  1.7 1.6*   Liver Function Tests: No results for input(s): AST, ALT, ALKPHOS, BILITOT, PROT, ALBUMIN in the last 168 hours. No results for input(s): LIPASE, AMYLASE in the last 168 hours. No results for input(s): AMMONIA in the last 168 hours. CBC: Recent Labs  Lab 05/30/19 2250  WBC 12.5*  HGB 16.5  HCT 51.6  MCV 90.8  PLT 387   Cardiac Enzymes: No results for input(s): CKTOTAL, CKMB, CKMBINDEX, TROPONINI in the last 168 hours. BNP: Invalid input(s): POCBNP CBG: Recent Labs  Lab 05/31/19 1150 05/31/19 1313 05/31/19 1442 05/31/19 1617 05/31/19 2129  GLUCAP 128* 166* 192* 228* 191*   D-Dimer No results for input(s): DDIMER in the last 72 hours. Hgb A1c Recent Labs    05/30/19 2250  HGBA1C 11.3*   Lipid Profile No results for input(s): CHOL, HDL, LDLCALC, TRIG, CHOLHDL, LDLDIRECT in the last 72 hours. Thyroid function studies No results for input(s): TSH, T4TOTAL, T3FREE, THYROIDAB in the last 72 hours.  Invalid input(s): FREET3 Anemia work up No results for input(s): VITAMINB12, FOLATE, FERRITIN, TIBC, IRON, RETICCTPCT in the last 72 hours. Urinalysis    Component Value Date/Time   COLORURINE STRAW (A) 05/30/2019 2250   APPEARANCEUR CLEAR 05/30/2019 2250   LABSPEC 1.023 05/30/2019 2250   PHURINE 5.0 05/30/2019 2250   GLUCOSEU >=500 (A) 05/30/2019 2250   HGBUR NEGATIVE 05/30/2019 2250   BILIRUBINUR NEGATIVE 05/30/2019 2250   KETONESUR 80 (A) 05/30/2019 2250   PROTEINUR 30 (A) 05/30/2019 2250   NITRITE NEGATIVE 05/30/2019 2250   LEUKOCYTESUR NEGATIVE 05/30/2019  2250   Sepsis Labs Invalid input(s): PROCALCITONIN,  WBC,  LACTICIDVEN Microbiology Recent Results (from the past 240 hour(s))  SARS Coronavirus 2 by RT PCR (hospital order, performed in [facility] hospital lab) Nasopharyngeal Urine, Clean Catch     Status: None   Collection Time: 05/31/19  2:02 AM   Specimen: Urine, Clean Catch; Nasopharyngeal  Result Value Ref Range Status   SARS Coronavirus 2 NEGATIVE NEGATIVE Final    Comment: (NOTE) SARS-CoV-2 target nucleic acids are NOT DETECTED. The SARS-CoV-2 RNA is generally detectable in upper and lower respiratory specimens during the acute phase of infection. The lowest concentration of SARS-CoV-2 viral copies this assay can detect is 250 copies / mL. A negative result does not preclude SARS-CoV-2 infection and should not be used as the sole basis for treatment or other patient management decisions.  A negative result may occur with improper specimen collection / handling, submission of specimen other than nasopharyngeal swab,  presence of viral mutation(s) within the areas targeted by this assay, and inadequate number of viral copies (<250 copies / mL). A negative result must be combined with clinical observations, patient history, and epidemiological information. Fact Sheet for Patients:   StrictlyIdeas.no Fact Sheet for Healthcare Providers: BankingDealers.co.za This test is not yet approved or cleared  by the [place] FDA and has been authorized for detection and/or diagnosis of SARS-CoV-2 by FDA under an Emergency Use Authorization (EUA).  This EUA will remain in effect (meaning this test can be used) for the duration of the COVID-19 declaration under Section 564(b)(1) of the Act, 21 U.S.C. section 360bbb-3(b)(1), unless the authorization is terminated or revoked sooner. Performed at [facility], [place] [address] [place], [place] [address]   MRSA PCR  Screening     Status: None   Collection Time: 05/31/19 10:31 AM   Specimen: Nasal Mucosa; Nasopharyngeal  Result Value Ref Range Status   MRSA by PCR NEGATIVE NEGATIVE Final    Comment:        The GeneXpert MRSA Assay (FDA approved for NASAL specimens only), is one component of a comprehensive MRSA colonization surveillance program. It is not intended to diagnose MRSA infection nor to guide or monitor treatment for MRSA infections. Performed at [facility], [place] [address], [place], [place] [address]      Time coordinating discharge: 45 minutes  SIGNED:   Tawni Millers, MD  [facility] 06/01/2019, 8:14 AM

## 2019-06-01 NOTE — TOC Progression Note (Signed)
Transition of Care [facility]) - Progression Note    Patient Details  Name: Derek Snyder MRN: [id number] Date of Birth: 1990-11-28  [facility]) CM/SW Contact  Golda Acre, RN Phone Number: 06/01/2019, 12:00 PM  Clinical Narrative:    dcd to home with self care no needs    Expected Discharge Plan: Home/Self Care Barriers to Discharge: Barriers Resolved  Expected Discharge Plan and Services Expected Discharge Plan: Home/Self Care   Discharge Planning Services: CM Consult   Living arrangements for the past 2 months: Single Family Home Expected Discharge Date: 06/01/19                                     Social Determinants of Health (SDOH) Interventions    Readmission Risk Interventions No flowsheet data found.

## 2021-04-17 IMAGING — DX DG CHEST 1V PORT
1 series · 1 of 1 positions shown · non-contrast
Comparison: None available.

CLINICAL DATA: Initial evaluation for DKA.

EXAM:
PORTABLE CHEST 1 VIEW

[chest ap]
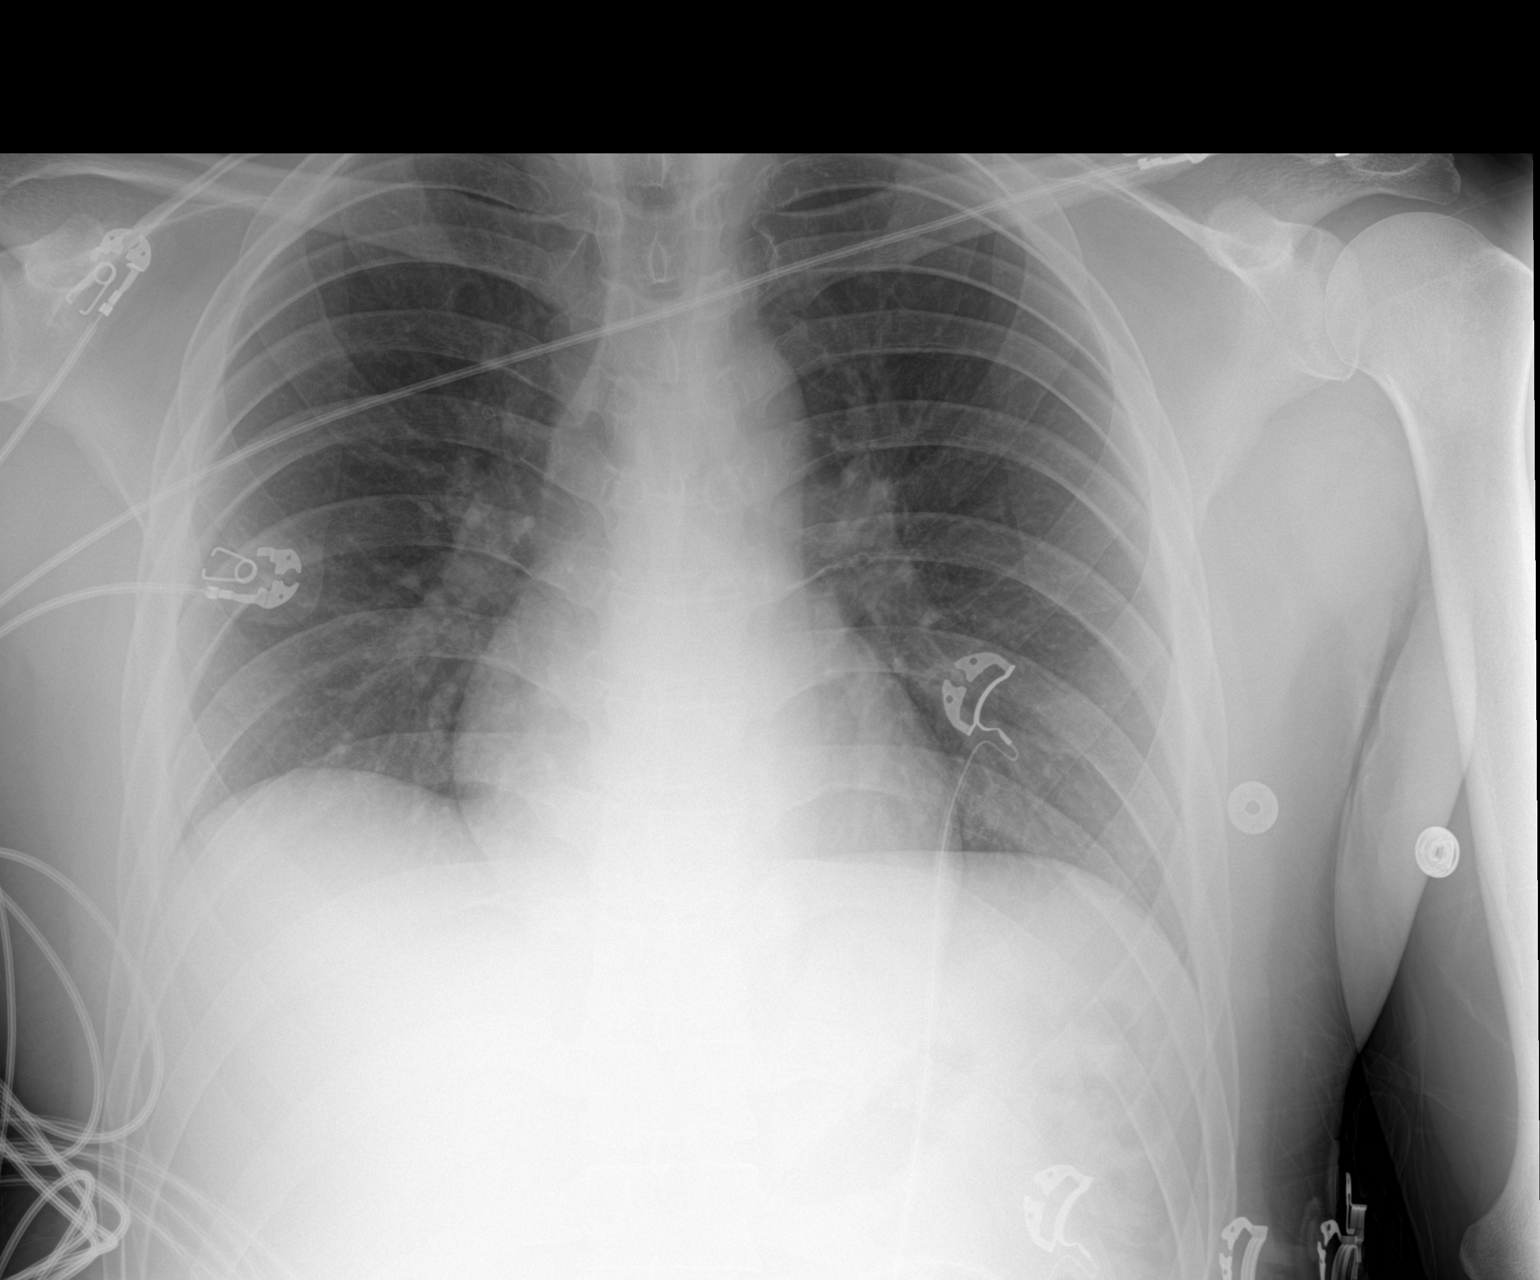

[1 of 1 positions shown; findings below may reference images not displayed]

FINDINGS: Cardiac and mediastinal silhouettes are within normal limits.

Lungs mildly hypoinflated. No focal infiltrates. No edema or
effusion. No pneumothorax.

No acute osseous finding.
IMPRESSION: No radiographic evidence for active cardiopulmonary disease.
